# Patient Record
Sex: Female | Born: 1945 | Race: Black or African American | Hispanic: No | Marital: Married | Smoking: Never smoker
Health system: Southern US, Community
[De-identification: ages and names within clinical notes are randomized; demographics above are authoritative.]

---

## 2004-10-09 ENCOUNTER — Emergency Department (HOSPITAL_COMMUNITY): Admission: EM | Admit: 2004-10-09 | Discharge: 2004-10-09 | Payer: Self-pay | Admitting: Emergency Medicine

## 2009-12-25 ENCOUNTER — Encounter: Admission: RE | Admit: 2009-12-25 | Discharge: 2009-12-25 | Payer: Self-pay | Admitting: Family Medicine

## 2010-01-23 ENCOUNTER — Encounter: Admission: RE | Admit: 2010-01-23 | Discharge: 2010-01-23 | Payer: Self-pay | Admitting: Family Medicine

## 2011-10-15 IMAGING — US US BREAST BILAT
1 series · 14 of 15 positions shown · non-contrast
Comparison: 12/25/2009

CLINICAL DATA: Further evaluation of bilateral upper outer
quadrant asymmetries

DIGITAL DIAGNOSTIC BILATERAL MAMMOGRAM  AND BILATERAL BREAST
ULTRASOUND:

[Series 1: us breast bilat · 14 of 15 slices shown]
[im 1/15]
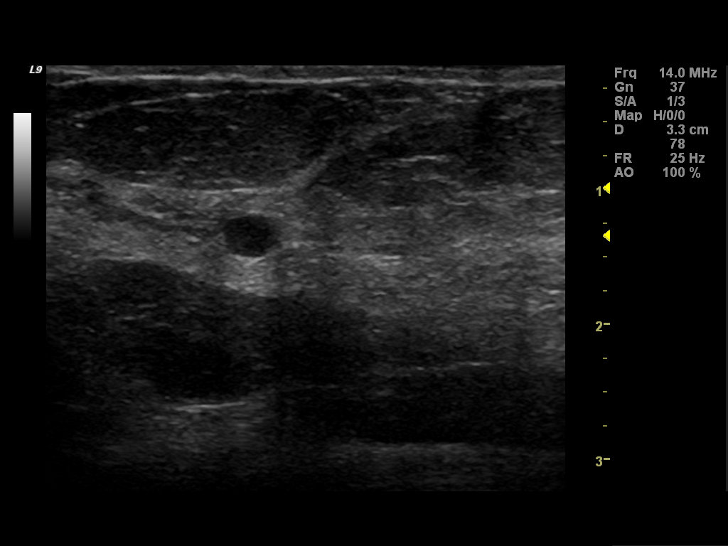
[im 2/15]
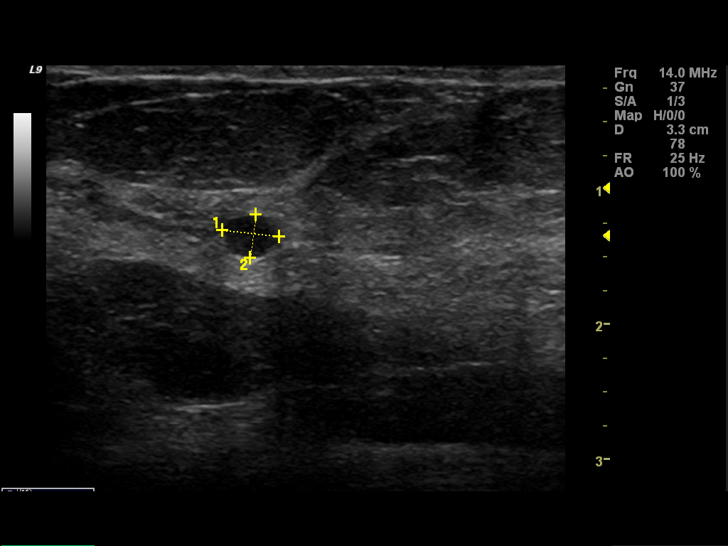
[im 3/15]
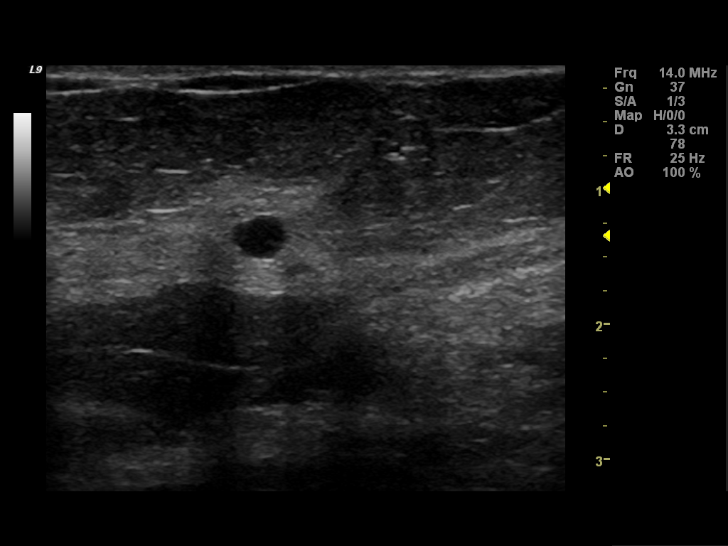
[im 4/15]
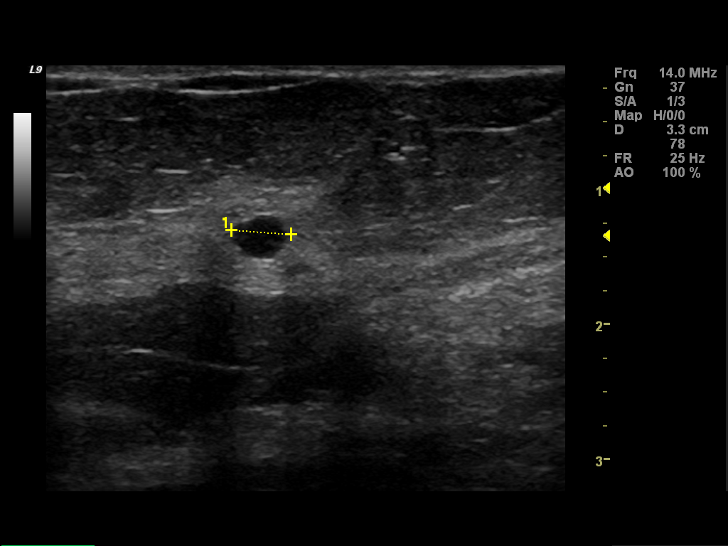
[im 5/15]
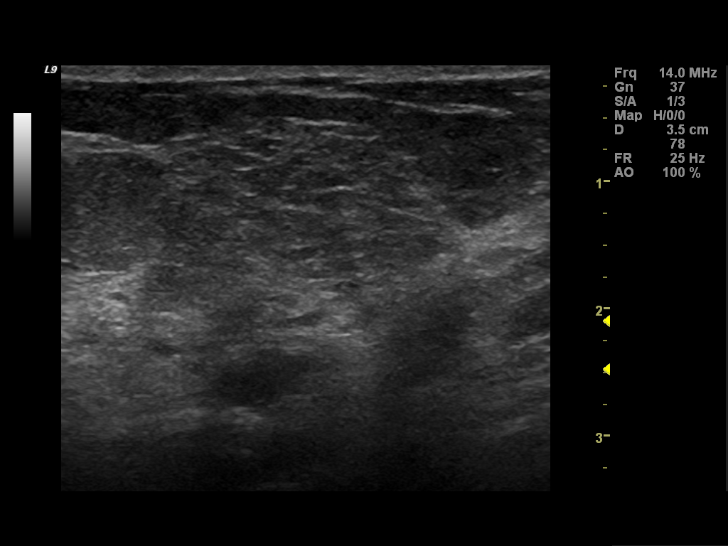
[im 6/15]
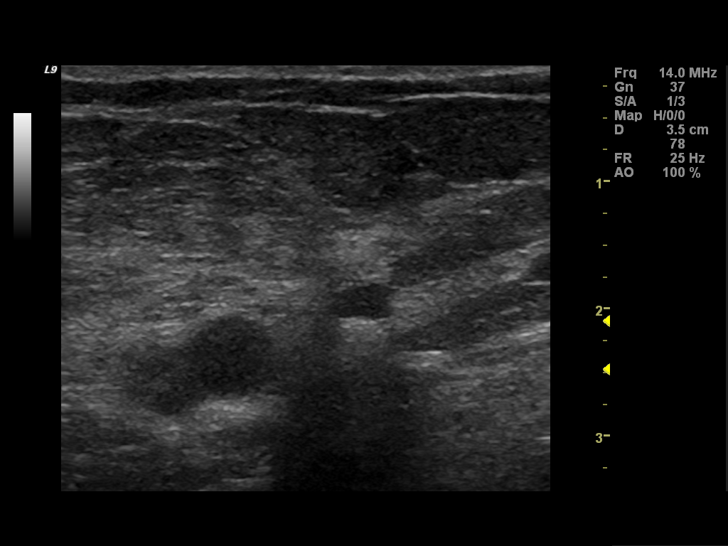
[im 7/15]
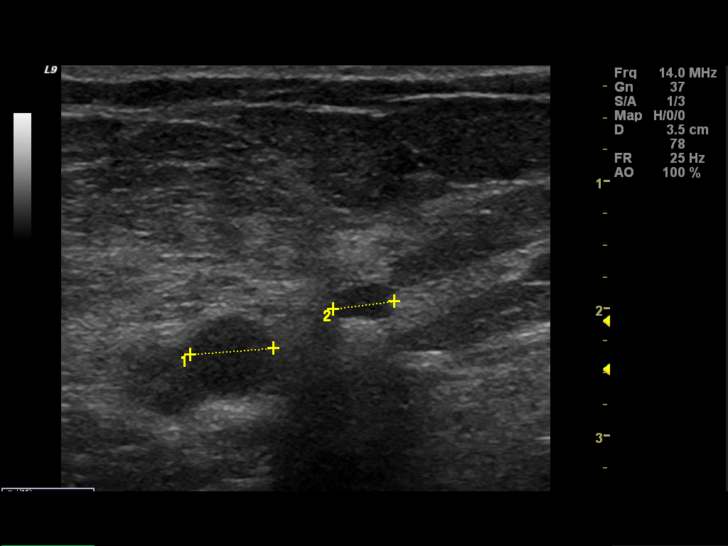
[im 9/15]
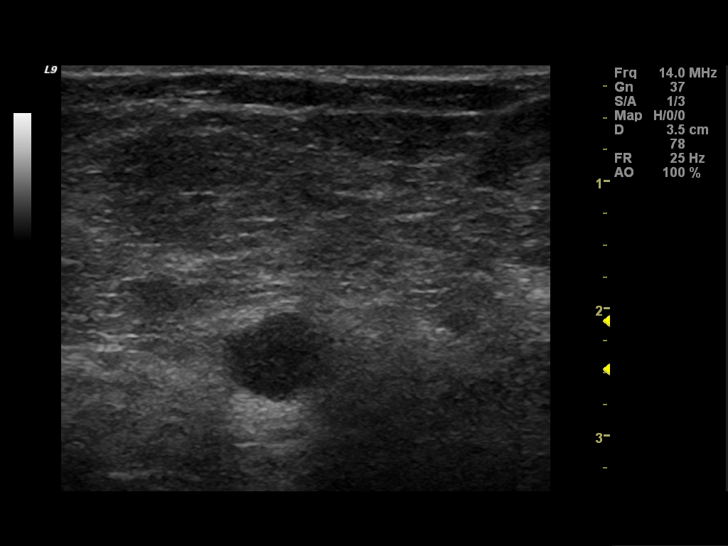
[im 10/15]
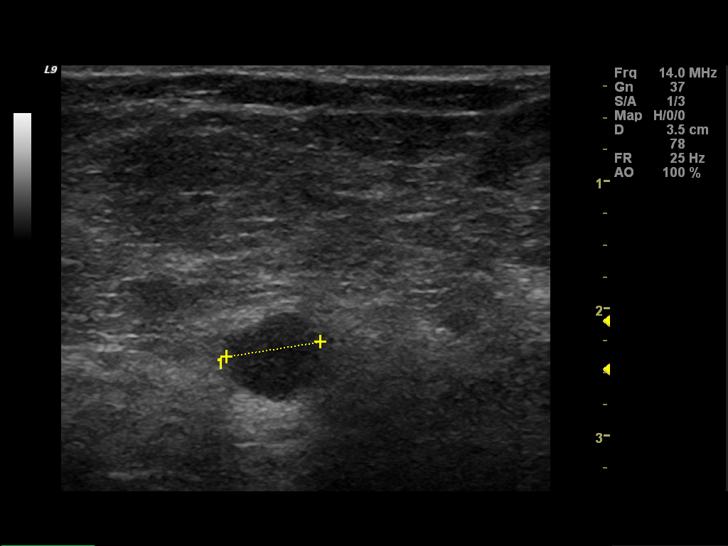
[im 11/15]
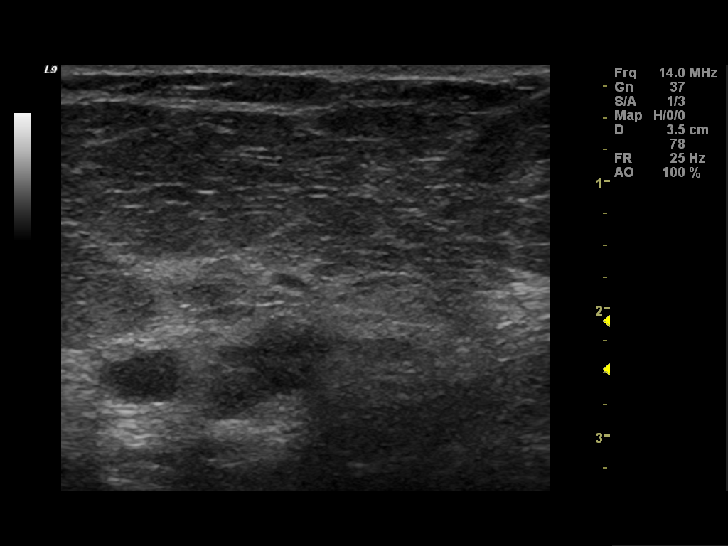
[im 12/15]
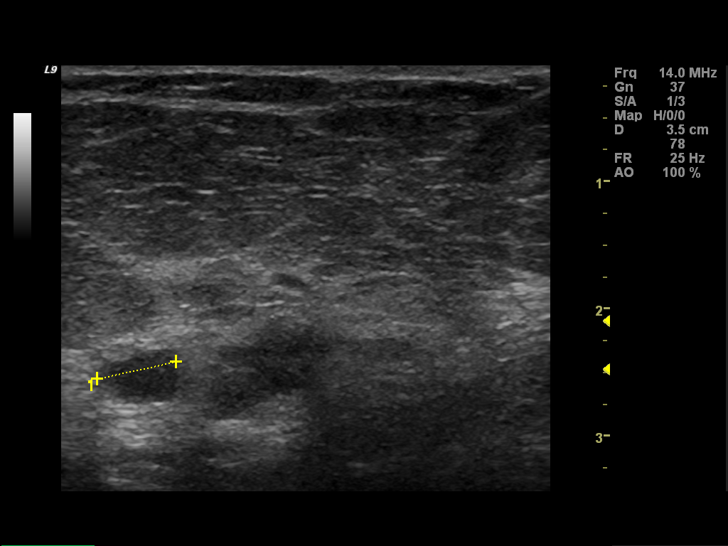
[im 13/15]
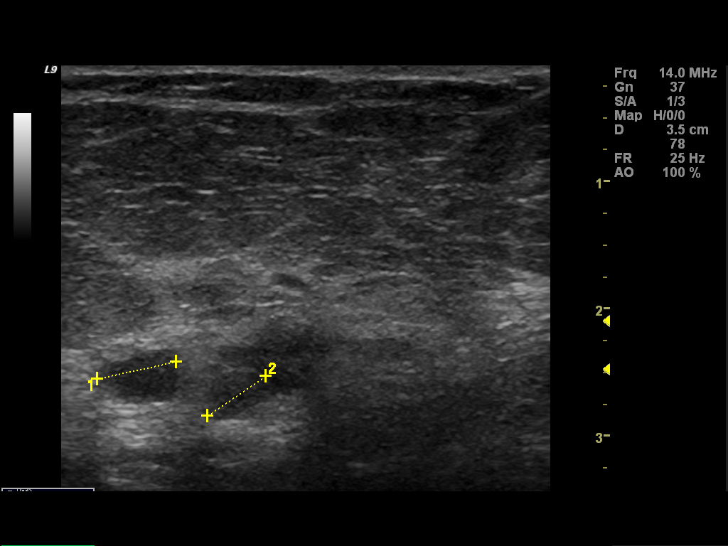
[im 14/15]
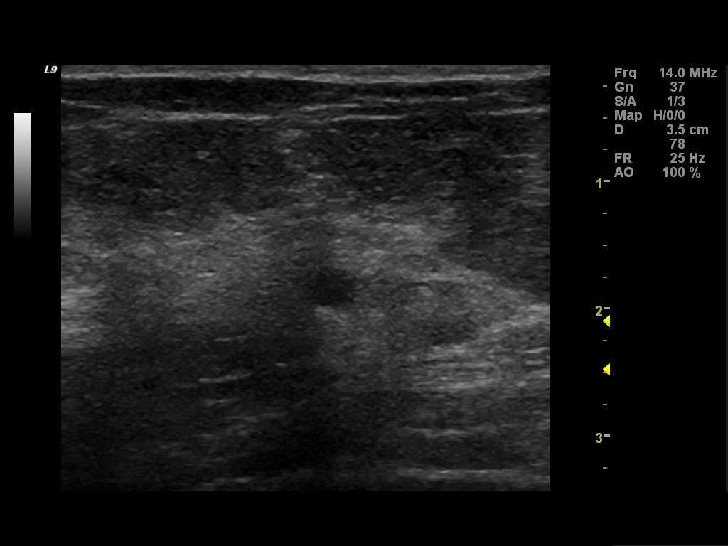
[im 15/15]
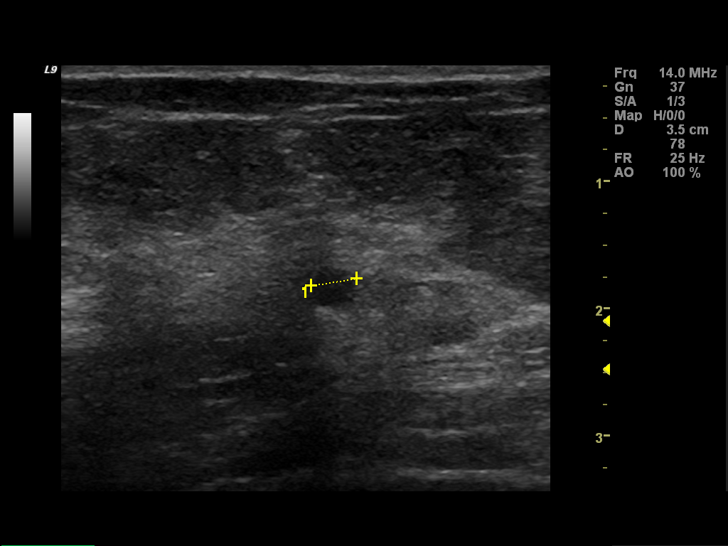

[14 of 15 positions shown; findings below may reference images not displayed]

FINDINGS: Persistent obscured 7 mm round upper outer quadrant mass
on the right.  Multiple obscured round isodense dense masses upper
outer quadrant on the left.

On physical exam, there are no palpable abnormalities.

Ultrasound is performed, showing at this simple cyst measuring 4 x
3 x 4 mm in the 10 o'clock right breast 4 cm from the nipple.
There are numerous simple cysts of the left 2 o'clock position 6 cm
from the nipple, measuring between five and 8 mm.
IMPRESSION: Bilateral cysts

BI-RADS CATEGORY 2:  Benign finding(s).

Recommendation:

Return to annual screening mammography.

## 2016-05-03 ENCOUNTER — Other Ambulatory Visit: Payer: Self-pay | Admitting: Family Medicine

## 2016-05-03 DIAGNOSIS — E2839 Other primary ovarian failure: Secondary | ICD-10-CM

## 2016-05-03 DIAGNOSIS — Z1231 Encounter for screening mammogram for malignant neoplasm of breast: Secondary | ICD-10-CM

## 2016-06-18 ENCOUNTER — Ambulatory Visit: Payer: Self-pay

## 2016-06-18 ENCOUNTER — Other Ambulatory Visit: Payer: Self-pay

## 2018-04-29 ENCOUNTER — Encounter: Payer: Self-pay | Admitting: *Deleted

## 2018-04-29 DIAGNOSIS — F411 Generalized anxiety disorder: Secondary | ICD-10-CM

## 2018-04-29 DIAGNOSIS — F3175 Bipolar disorder, in partial remission, most recent episode depressed: Secondary | ICD-10-CM | POA: Insufficient documentation

## 2018-04-29 DIAGNOSIS — G4700 Insomnia, unspecified: Secondary | ICD-10-CM

## 2018-05-23 ENCOUNTER — Ambulatory Visit: Payer: Self-pay | Admitting: Physician Assistant

## 2018-07-04 ENCOUNTER — Ambulatory Visit: Payer: Medicare Other | Admitting: Physician Assistant

## 2018-07-04 ENCOUNTER — Encounter: Payer: Self-pay | Admitting: Physician Assistant

## 2018-07-04 DIAGNOSIS — F319 Bipolar disorder, unspecified: Secondary | ICD-10-CM

## 2018-07-04 DIAGNOSIS — G4700 Insomnia, unspecified: Secondary | ICD-10-CM

## 2018-07-04 DIAGNOSIS — F411 Generalized anxiety disorder: Secondary | ICD-10-CM | POA: Diagnosis not present

## 2018-07-04 MED ORDER — ZOLPIDEM TARTRATE 10 MG PO TABS: 10.0000 mg | ORAL_TABLET | Freq: Every evening | ORAL | 1 refills | Status: DC | PRN

## 2018-07-04 MED ORDER — QUETIAPINE FUMARATE 400 MG PO TABS: 400.0000 mg | ORAL_TABLET | Freq: Every day | ORAL | 1 refills | Status: DC

## 2018-07-04 MED ORDER — LORAZEPAM 1 MG PO TABS: 1.0000 mg | ORAL_TABLET | Freq: Three times a day (TID) | ORAL | 1 refills | Status: DC | PRN

## 2018-07-04 NOTE — Progress Notes (Signed)
[facility]  Patient ID: Joy SchmidtBernice Reichenbach,  MRN: [id number]  PCP: Patient, No Pcp Per  Date of Evaluation: 07/04/2018 Time spent:15 minutes  Chief Complaint:  Chief Complaint    Medication Refill      HISTORY/CURRENT STATUS: HPI Here for 6 month med check.  Patient denies loss of interest in usual activities and is able to enjoy things.  Denies decreased energy or motivation.  Appetite has not changed.  No extreme sadness, tearfulness, or feelings of hopelessness.  Denies any changes in concentration, making decisions or remembering things.  Denies suicidal or homicidal thoughts.  Patient denies increased energy with decreased need for sleep, no increased talkativeness, no racing thoughts, no impulsivity or risky behaviors, no increased spending, no increased libido, no grandiosity.  She sleeps well as long as she has the Ambien.  Anxiety is well controlled.  She uses the Ativan times a day when needed and it is effective.  She denies any falls, or new medical problems.  No recent surgeries since LOV.  No new medications.  She and her husband still like to travel a lot.  Since she was here last they went to [place] and had a good time.  They are going to visit their daughter in [place] over Thanksgiving and are looking forward to that.  Individual Medical History/ Review of Systems: Changes? :No    Past medications for mental health diagnoses include: Seroquel, Ativan, Ambien  Allergies: Aspirin  Current Medications:  Current Outpatient Medications:  .  LORazepam (ATIVAN) 1 MG tablet, Take 1 tablet (1 mg total) by mouth every 8 (eight) hours as needed for anxiety., Disp: 135 tablet, Rfl: 1 .  QUEtiapine (SEROQUEL) 400 MG tablet, Take 1 tablet (400 mg total) by mouth at bedtime., Disp: 90 tablet, Rfl: 1 .  zolpidem (AMBIEN) 10 MG tablet, Take 1 tablet (10 mg total) by mouth at bedtime as needed for sleep., Disp: 90 tablet, Rfl: 1 Medication Side Effects:  none  Family Medical/ Social History: Changes? No  MENTAL HEALTH EXAM:  There were no vitals taken for this visit.There is no height or weight on file to calculate BMI.  General Appearance: Casual and Well Groomed  Eye Contact:  Good  Speech:  Clear and Coherent  Volume:  Normal  Mood:  Euthymic  Affect:  Appropriate  Thought Process:  Goal Directed  Orientation:  Full (Time, Place, and Person)  Thought Content: Logical   Suicidal Thoughts:  No  Homicidal Thoughts:  No  Memory:  WNL  Judgement:  Good  Insight:  Good  Psychomotor Activity:  Normal  Concentration:  Concentration: Good  Recall:  Good  Fund of Knowledge: Good  Language: Good  Assets:  Desire for Improvement  ADL's:  Intact she uses a walking cane to assist in ambulation  Cognition: WNL  Prognosis:  Good  See lipid panel and glucose on chart from January 2019.  DIAGNOSES:    ICD-10-CM   1. Bipolar I disorder (HCC) F31.9   2. Generalized anxiety disorder F41.1   3. Insomnia, unspecified type G47.00     Receiving Psychotherapy: No    RECOMMENDATIONS: I am glad to see her doing well. Continue all medications. Return in 6 months or sooner as needed.   Melony Overlyeresa Hakan Nudelman, PA-C

## 2019-01-02 ENCOUNTER — Ambulatory Visit (INDEPENDENT_AMBULATORY_CARE_PROVIDER_SITE_OTHER): Payer: Medicare Other | Admitting: Physician Assistant

## 2019-01-02 ENCOUNTER — Encounter: Payer: Self-pay | Admitting: Physician Assistant

## 2019-01-02 ENCOUNTER — Other Ambulatory Visit: Payer: Self-pay

## 2019-01-02 DIAGNOSIS — F411 Generalized anxiety disorder: Secondary | ICD-10-CM

## 2019-01-02 DIAGNOSIS — F319 Bipolar disorder, unspecified: Secondary | ICD-10-CM | POA: Diagnosis not present

## 2019-01-02 MED ORDER — QUETIAPINE FUMARATE 400 MG PO TABS: 400.0000 mg | ORAL_TABLET | Freq: Every day | ORAL | 1 refills | Status: DC

## 2019-01-02 MED ORDER — LORAZEPAM 1 MG PO TABS: 1.0000 mg | ORAL_TABLET | Freq: Three times a day (TID) | ORAL | 1 refills | Status: DC | PRN

## 2019-01-02 NOTE — Progress Notes (Signed)
[facility]  Patient ID: Joy Marshall,  MRN: [id number]  PCP: Patient, No Pcp Per  Date of Evaluation: 01/02/2019 Time spent:15 minutes  Chief Complaint:  Chief Complaint    Follow-up     Virtual Visit via Telephone Note  I connected with patient by a video enabled telemedicine application or telephone, with their informed consent, and verified patient privacy and that I am speaking with the correct person using two identifiers.  I am private, in my home and the patient is home.  I discussed the limitations, risks, security and privacy concerns of performing an evaluation and management service by telephone and the availability of in person appointments. I also discussed with the patient that there may be a patient responsible charge related to this service. The patient expressed understanding and agreed to proceed.   I discussed the assessment and treatment plan with the patient. The patient was provided an opportunity to ask questions and all were answered. The patient agreed with the plan and demonstrated an understanding of the instructions.   The patient was advised to call back or seek an in-person evaluation if the symptoms worsen or if the condition fails to improve as anticipated.  I provided 15 minutes of non-face-to-face time during this encounter.  HISTORY/CURRENT STATUS: HPI 54-month med check.  States she is doing really well.  She sleeps well and feels rested when she gets up.  She is able to enjoy things.  Energy and motivation are good.  She is not isolating any more than she has to for the coronavirus pandemic.  She does not cry easily.  Denies suicidal or homicidal thoughts.  Patient denies increased energy with decreased need for sleep, no increased talkativeness, no racing thoughts, no impulsivity or risky behaviors, no increased spending, no increased libido, no grandiosity.  Denies dizziness, syncope, seizures, numbness, tingling, tremor, tics,  unsteady gait, slurred speech, confusion. Denies muscle or joint pain, stiffness, or dystonia.  Individual Medical History/ Review of Systems: Changes? :No    Past medications for mental health diagnoses include: Seroquel, Ativan, Ambien  Allergies: Aspirin  Current Medications:  Current Outpatient Medications:  .  LORazepam (ATIVAN) 1 MG tablet, Take 1 tablet (1 mg total) by mouth every 8 (eight) hours as needed for anxiety., Disp: 135 tablet, Rfl: 1 .  Multiple Vitamin (MULTIVITAMIN) tablet, Take 1 tablet by mouth daily., Disp: , Rfl:  .  QUEtiapine (SEROQUEL) 400 MG tablet, Take 1 tablet (400 mg total) by mouth at bedtime., Disp: 90 tablet, Rfl: 1 .  zolpidem (AMBIEN) 10 MG tablet, Take 1 tablet (10 mg total) by mouth at bedtime as needed for sleep., Disp: 90 tablet, Rfl: 1 Medication Side Effects: none   Family Medical/ Social History: Changes? No  MENTAL HEALTH EXAM:  There were no vitals taken for this visit.There is no height or weight on file to calculate BMI.  General Appearance: Unable to assess  Eye Contact:  Unable to assess  Speech:  Clear and Coherent  Volume:  Normal  Mood:  Euthymic  Affect:  Unable to assess  Thought Process:  Goal Directed  Orientation:  Full (Time, Place, and Person)  Thought Content: Logical   Suicidal Thoughts:  No  Homicidal Thoughts:  No  Memory:  WNL  Judgement:  Good  Insight:  Good  Psychomotor Activity:  Unable to assess  Concentration:  Concentration: Good  Recall:  Good  Fund of Knowledge: Good  Language: Good  Assets:  Desire for Improvement  ADL's:  Intact  Cognition: WNL  Prognosis:  Good    DIAGNOSES:    ICD-10-CM   1. Bipolar I disorder (HCC) F31.9   2. Generalized anxiety disorder F41.1     Receiving Psychotherapy: No    RECOMMENDATIONS: I am glad to see her doing so well! Continue Seroquel 400 mg nightly. Continue Ativan 1 mg 1 every 8 hours PRN, with occasional extra half as needed during the day. Return  in 6 months.  Melony Overlyeresa Winford Hehn, PA-C   This record has been created using [employer] software.  Chart creation errors have been sought, but may not always have been located and corrected. Such creation errors do not reflect on the standard of medical care.

## 2019-07-09 ENCOUNTER — Ambulatory Visit (INDEPENDENT_AMBULATORY_CARE_PROVIDER_SITE_OTHER): Payer: Medicare Other | Admitting: Physician Assistant

## 2019-07-09 ENCOUNTER — Other Ambulatory Visit: Payer: Self-pay

## 2019-07-09 ENCOUNTER — Encounter: Payer: Self-pay | Admitting: Physician Assistant

## 2019-07-09 DIAGNOSIS — F411 Generalized anxiety disorder: Secondary | ICD-10-CM

## 2019-07-09 DIAGNOSIS — F319 Bipolar disorder, unspecified: Secondary | ICD-10-CM | POA: Diagnosis not present

## 2019-07-09 MED ORDER — QUETIAPINE FUMARATE 400 MG PO TABS: 400.0000 mg | ORAL_TABLET | Freq: Every day | ORAL | 1 refills | Status: DC

## 2019-07-09 MED ORDER — LORAZEPAM 1 MG PO TABS: 1.0000 mg | ORAL_TABLET | Freq: Three times a day (TID) | ORAL | 1 refills | Status: DC | PRN

## 2019-07-09 NOTE — Progress Notes (Signed)
[facility]  Patient ID: Joy Marshall,  MRN: [id number]  PCP: Patient, No Pcp Per  Date of Evaluation: 07/09/2019 Time spent:15 minutes  Chief Complaint:  Chief Complaint    Follow-up      HISTORY/CURRENT STATUS: HPI for 7-month med check.  States she is doing very well under the circumstances.  She and her husband state and a lot and are as careful as possible to not get COVID.  Neither of them have been sick.  She is getting tired of having to stay and all the time and not having any place to go.  "I feel like if everybody would do this then COVID would go away quickly.  But they want."  Feels like her medications are working.  She sleeps well.  The anxiety is well controlled.Patient denies loss of interest in usual activities and is able to enjoy things.  Denies decreased energy or motivation.  Appetite has not changed.  No extreme sadness, tearfulness, or feelings of hopelessness.  Denies any changes in concentration, making decisions or remembering things.  Denies suicidal or homicidal thoughts.  Patient denies increased energy with decreased need for sleep, no increased talkativeness, no racing thoughts, no impulsivity or risky behaviors, no increased spending, no increased libido, no grandiosity.  No hallucinations.  Denies dizziness, syncope, seizures, numbness, tingling, tremor, tics, unsteady gait, slurred speech, confusion. Denies muscle or joint pain, stiffness, or dystonia.  Individual Medical History/ Review of Systems: Changes? :No    Past medications for mental health diagnoses include: Seroquel, Ativan, Ambien  Allergies: Aspirin  Current Medications:  Current Outpatient Medications:  .  LORazepam (ATIVAN) 1 MG tablet, Take 1 tablet (1 mg total) by mouth every 8 (eight) hours as needed for anxiety., Disp: 135 tablet, Rfl: 1 .  Multiple Vitamin (MULTIVITAMIN) tablet, Take 1 tablet by mouth daily., Disp: , Rfl:  .  QUEtiapine (SEROQUEL) 400 MG  tablet, Take 1 tablet (400 mg total) by mouth at bedtime., Disp: 90 tablet, Rfl: 1 .  zolpidem (AMBIEN) 10 MG tablet, Take 1 tablet (10 mg total) by mouth at bedtime as needed for sleep., Disp: 90 tablet, Rfl: 1 Medication Side Effects: none  Family Medical/ Social History: Changes? No  MENTAL HEALTH EXAM:  There were no vitals taken for this visit.There is no height or weight on file to calculate BMI.  General Appearance: Casual, Neat and Well Groomed  Eye Contact:  Good  Speech:  Clear and Coherent  Volume:  Normal  Mood:  Euthymic  Affect:  Appropriate  Thought Process:  Goal Directed  Orientation:  Full (Time, Place, and Person)  Thought Content: Logical   Suicidal Thoughts:  No  Homicidal Thoughts:  No  Memory:  WNL  Judgement:  Good  Insight:  Good  Psychomotor Activity:  Normal  Concentration:  Concentration: Good  Recall:  Good  Fund of Knowledge: Good  Language: Good  Assets:  Desire for Improvement  ADL's:  Intact  Cognition: WNL  Prognosis:  Good    DIAGNOSES:    ICD-10-CM   1. Bipolar I disorder (HCC)  F31.9   2. Generalized anxiety disorder  F41.1     Receiving Psychotherapy: No    RECOMMENDATIONS:  I am glad to see her doing so well! Continue Seroquel 400 mg, 1 p.o. nightly. Continue Ativan 1 mg, 1/2-1 p.o. 3 times daily as needed. PDMP was reviewed. She has a complete physical coming up in January.  I have asked her to get labs, specifically hemoglobin  A1c, lipid panel, and CMP drawn for me as well. Return in 6 months.  Donnal Moat, PA-C

## 2019-12-21 ENCOUNTER — Other Ambulatory Visit: Payer: Self-pay | Admitting: Physician Assistant

## 2019-12-21 NOTE — Telephone Encounter (Signed)
Refill due around 05/17, next apt 05/25

## 2020-01-01 ENCOUNTER — Ambulatory Visit: Payer: Medicare Other | Admitting: Physician Assistant

## 2020-03-13 ENCOUNTER — Other Ambulatory Visit: Payer: Self-pay | Admitting: Physician Assistant

## 2020-06-08 ENCOUNTER — Other Ambulatory Visit: Payer: Self-pay | Admitting: Physician Assistant

## 2020-09-29 ENCOUNTER — Other Ambulatory Visit: Payer: Self-pay | Admitting: Physician Assistant

## 2021-04-15 ENCOUNTER — Ambulatory Visit: Payer: Medicare Other | Admitting: Physician Assistant

## 2021-04-15 ENCOUNTER — Encounter: Payer: Self-pay | Admitting: Physician Assistant

## 2021-04-15 ENCOUNTER — Other Ambulatory Visit: Payer: Self-pay

## 2021-04-15 DIAGNOSIS — F319 Bipolar disorder, unspecified: Secondary | ICD-10-CM | POA: Diagnosis not present

## 2021-04-15 DIAGNOSIS — F411 Generalized anxiety disorder: Secondary | ICD-10-CM

## 2021-04-15 MED ORDER — QUETIAPINE FUMARATE 400 MG PO TABS: 400.0000 mg | ORAL_TABLET | Freq: Every day | ORAL | 1 refills | Status: DC

## 2021-04-15 MED ORDER — LORAZEPAM 1 MG PO TABS: 1.0000 mg | ORAL_TABLET | Freq: Three times a day (TID) | ORAL | 1 refills | Status: DC | PRN

## 2021-04-15 NOTE — Progress Notes (Signed)
[facility]  Patient ID: Joy Marshall,  MRN: [id number]  PCP: Patient, No Pcp Per (Inactive)  Date of Evaluation: 04/15/2021 Time spent:30 minutes  Chief Complaint:  Chief Complaint   Follow-up     HISTORY/CURRENT STATUS: HPI for 52-month med check.  Lost to follow-up for over a year, she had enough medications and did not feel like she needed to come.  She presented to her PCP a few months ago requesting refill for Ativan.  She wanted to come back here for further refills.  States her PCP would not give her enough and she was requiring her to go in for an appointment each time.  When the patient was seen here routinely we were going every 6 months between visits because she was doing well.          States she is still doing well.Patient denies loss of interest in usual activities and is able to enjoy things.  Denies decreased energy or motivation.  Appetite has not changed.  No extreme sadness, tearfulness, or feelings of hopelessness.  Denies any changes in concentration, making decisions or remembering things.  Denies suicidal or homicidal thoughts.  Anxiety is controlled.  Not having panic attacks just more feelings of unease and worry at times.  She usually takes one Ativan every day sometimes 1.5 pills though.  She is kind of worried about her husband who is having some heart problems.  He may be having a pacemaker placed soon.  Patient denies increased energy with decreased need for sleep, no increased talkativeness, no racing thoughts, no impulsivity or risky behaviors, no increased spending, no increased libido, no grandiosity, no increased irritability or anger, and no hallucinations.  Denies dizziness, syncope, seizures, numbness, tingling, tremor, tics, unsteady gait, slurred speech, confusion. Denies muscle or joint pain, stiffness, or dystonia.  Individual Medical History/ Review of Systems: Changes? :No    Past medications for mental health diagnoses  include: Seroquel, Ativan, Ambien  Allergies: Aspirin  Current Medications:  Current Outpatient Medications:    Multiple Vitamin (MULTIVITAMIN) tablet, Take 1 tablet by mouth daily., Disp: , Rfl:    LORazepam (ATIVAN) 1 MG tablet, Take 1 tablet (1 mg total) by mouth every 8 (eight) hours as needed for anxiety., Disp: 135 tablet, Rfl: 1   QUEtiapine (SEROQUEL) 400 MG tablet, Take 1 tablet (400 mg total) by mouth at bedtime., Disp: 90 tablet, Rfl: 1   zolpidem (AMBIEN) 10 MG tablet, Take 1 tablet (10 mg total) by mouth at bedtime as needed for sleep., Disp: 90 tablet, Rfl: 1 Medication Side Effects: none  Family Medical/ Social History: Changes? No  MENTAL HEALTH EXAM:  There were no vitals taken for this visit.There is no height or weight on file to calculate BMI.  General Appearance: Casual, Neat and Well Groomed  Eye Contact:  Good  Speech:  Clear and Coherent  Volume:  Normal  Mood:  Euthymic  Affect:  Appropriate  Thought Process:  Goal Directed and Descriptions of Associations: Circumstantial  Orientation:  Full (Time, Place, and Person)  Thought Content: Logical   Suicidal Thoughts:  No  Homicidal Thoughts:  No  Memory:  WNL  Judgement:  Good  Insight:  Good  Psychomotor Activity:  Normal  Concentration:  Concentration: Good  Recall:  Good  Fund of Knowledge: Good  Language: Good  Assets:  Desire for Improvement  ADL's:  Intact  Cognition: WNL  Prognosis:  Good   Labs through [facility] 12/26/2020 were reviewed. Specifically glucose 95.  Lipid panel, total cholesterol is 270, triglycerides 100, HDL 60, LDL 193.  DIAGNOSES:    ICD-10-CM   1. Bipolar I disorder (HCC)  F31.9     2. Generalized anxiety disorder  F41.1        Receiving Psychotherapy: No    RECOMMENDATIONS:  PDMP reviewed last Ativan per PCP 02/24/2021-no red flags. I provided 30 minutes of face to face time during this encounter, including time spent before and after the visit in records  review, medical decision making, and charting.  She is doing well so no changes will be made. Continue Seroquel 400 mg, 1 p.o. nightly. Continue Ativan 1 mg, 1/2-1 p.o. 3 times daily as needed. Return in 6 months.  Melony Overly, PA-C

## 2021-10-13 ENCOUNTER — Ambulatory Visit: Payer: Medicare Other | Admitting: Physician Assistant

## 2021-10-13 ENCOUNTER — Other Ambulatory Visit: Payer: Self-pay

## 2021-10-13 ENCOUNTER — Encounter: Payer: Self-pay | Admitting: Physician Assistant

## 2021-10-13 DIAGNOSIS — F411 Generalized anxiety disorder: Secondary | ICD-10-CM

## 2021-10-13 DIAGNOSIS — F319 Bipolar disorder, unspecified: Secondary | ICD-10-CM

## 2021-10-13 MED ORDER — LORAZEPAM 1 MG PO TABS: 1.0000 mg | ORAL_TABLET | Freq: Three times a day (TID) | ORAL | 1 refills | Status: DC | PRN

## 2021-10-13 MED ORDER — QUETIAPINE FUMARATE 400 MG PO TABS: 400.0000 mg | ORAL_TABLET | Freq: Every day | ORAL | 1 refills | Status: DC

## 2021-10-13 NOTE — Progress Notes (Signed)
[facility] ? ?Patient ID: Joy Marshall,  ?MRN: [id number] ? ?PCP: Patient, No Pcp Per (Inactive) ? ?Date of Evaluation: 10/13/2021 ?Time spent:30 minutes ? ?Chief Complaint:  ?Chief Complaint   ?Anxiety; Follow-up ?  ? ? ?HISTORY/CURRENT STATUS: ?HPI For 6 month med check. ? ?Doing well.  Initially she asked if she could decrease the dose of her medicines or even go off but then states that she tried that long ago on 4 different occasions and it did not go well so she better keep things like they are. ? ?Patient denies loss of interest in usual activities and is able to enjoy things.  She has not been traveling very much, mostly still because of COVID.  She and her husband do like to travel though.  Denies decreased energy or motivation.  Appetite has not changed.  No extreme sadness, tearfulness, or feelings of hopelessness.  Denies any changes in concentration, making decisions or remembering things.  She sleeps well.  Does have anxiety still and needs the Ativan but usually only takes 1/2 pill during the day when she needs it.  Denies suicidal or homicidal thoughts. ? ?Patient denies increased energy with decreased need for sleep, no increased talkativeness, no racing thoughts, no impulsivity or risky behaviors, no increased spending, no increased libido, no grandiosity, no increased irritability or anger, no paranoia, and no hallucinations. ? ?Denies dizziness, syncope, seizures, numbness, tingling, tremor, tics, unsteady gait, slurred speech, confusion. Denies muscle or joint pain, stiffness, or dystonia. Denies unexplained weight loss, frequent infections, or sores that heal slowly.  No polyphagia, polydipsia, or polyuria. Denies visual changes or paresthesias.  ? ?Individual Medical History/ Review of Systems: Changes? :No  ? ?Past medications for mental health diagnoses include: ?Seroquel, Ativan, Ambien ? ?Allergies: Aspirin ? ?Current Medications:  ?Current Outpatient Medications:  ?   Multiple Vitamin (MULTIVITAMIN) tablet, Take 1 tablet by mouth daily., Disp: , Rfl:  ?  LORazepam (ATIVAN) 1 MG tablet, Take 1 tablet (1 mg total) by mouth every 8 (eight) hours as needed for anxiety., Disp: 135 tablet, Rfl: 1 ?  QUEtiapine (SEROQUEL) 400 MG tablet, Take 1 tablet (400 mg total) by mouth at bedtime., Disp: 90 tablet, Rfl: 1 ?Medication Side Effects: none ? ?Family Medical/ Social History: Changes? No ? ?MENTAL HEALTH EXAM: ? ?There were no vitals taken for this visit.There is no height or weight on file to calculate BMI.  ?General Appearance: Casual, Neat, Well Groomed, and Obese  ?Eye Contact:  Good  ?Speech:  Clear and Coherent and Normal Rate  ?Volume:  Decreased  ?Mood:  Euthymic  ?Affect:  Congruent  ?Thought Process:  Goal Directed and Descriptions of Associations: Circumstantial  ?Orientation:  Full (Time, Place, and Person)  ?Thought Content: Logical   ?Suicidal Thoughts:  No  ?Homicidal Thoughts:  No  ?Memory:  WNL  ?Judgement:  Good  ?Insight:  Good  ?Psychomotor Activity:   Walks with a cane  ?Concentration:  Concentration: Good  ?Recall:  Good  ?Fund of Knowledge: Good  ?Language: Good  ?Assets:  Desire for Improvement  ?ADL's:  Intact  ?Cognition: WNL  ?Prognosis:  Good  ? ?Labs 09/16/2021 under Care Everywhere through [facility] ?Glucose 84, remainder of CMP normal ?Total cholesterol 179, triglycerides 50, HDL 57, LDL 112 ? ? ?DIAGNOSES:  ?  ICD-10-CM   ?1. Bipolar I disorder ([place])  F31.9   ?  ?2. Generalized anxiety disorder  F41.1   ?  ? ? ?Receiving Psychotherapy: No  ? ? ?  RECOMMENDATIONS:  ?PDMP reviewed.  Ativan filled 07/23/2021. ?I provided 30 minutes of face to face time during this encounter, including time spent before and after the visit in records review, medical decision making, counseling pertinent to today's visit, and charting.  ?We discussed whether she should decrease the dose of Seroquel and Ativan or not.  I do not think it is a good idea for more than 1 reason but  especially since she has gone off her mental health medications for different times in the past 15 years or so and "it did not go well" so I have no reason to think it would go well at this time.  She is mentally healthy on these medications, is having no side effects from them, and her metabolic panels show no problems from being on an antipsychotic.  So we agree for her to continue same medications and doses. ? ?Continue Seroquel 400 mg, 1 p.o. nightly. ?Continue Ativan 1 mg, 1 p.o. 3 times daily as needed.  Use as sparingly as possible, risk of falls increase with age, she understands and accepts that risk. ?Return in 6 months. ? ?Donnal Moat, PA-C  ?

## 2022-04-15 ENCOUNTER — Ambulatory Visit (INDEPENDENT_AMBULATORY_CARE_PROVIDER_SITE_OTHER): Payer: Self-pay | Admitting: Physician Assistant

## 2022-04-15 DIAGNOSIS — Z91199 Patient's noncompliance with other medical treatment and regimen due to unspecified reason: Secondary | ICD-10-CM

## 2022-04-15 NOTE — Progress Notes (Signed)
No show

## 2022-04-19 ENCOUNTER — Other Ambulatory Visit: Payer: Self-pay | Admitting: Physician Assistant

## 2022-04-21 NOTE — Telephone Encounter (Signed)
Please schedule appt

## 2022-04-23 ENCOUNTER — Other Ambulatory Visit: Payer: Self-pay

## 2022-06-02 ENCOUNTER — Ambulatory Visit (INDEPENDENT_AMBULATORY_CARE_PROVIDER_SITE_OTHER): Payer: Medicare Other | Admitting: Physician Assistant

## 2022-06-02 ENCOUNTER — Telehealth: Payer: Self-pay | Admitting: Adult Health

## 2022-06-02 ENCOUNTER — Encounter: Payer: Self-pay | Admitting: Physician Assistant

## 2022-06-02 DIAGNOSIS — F411 Generalized anxiety disorder: Secondary | ICD-10-CM | POA: Diagnosis not present

## 2022-06-02 DIAGNOSIS — F319 Bipolar disorder, unspecified: Secondary | ICD-10-CM

## 2022-06-02 MED ORDER — QUETIAPINE FUMARATE 400 MG PO TABS: 400.0000 mg | ORAL_TABLET | Freq: Every day | ORAL | 1 refills | Status: DC

## 2022-06-02 MED ORDER — LORAZEPAM 1 MG PO TABS: 1.0000 mg | ORAL_TABLET | Freq: Three times a day (TID) | ORAL | 1 refills | Status: DC | PRN

## 2022-06-02 NOTE — Progress Notes (Signed)
[facility]  Patient ID: Joy Marshall,  MRN: [id number]  PCP: Patient, No Pcp Per  Date of Evaluation: 06/02/2022 Time spent:30 minutes  Chief Complaint:  Chief Complaint   Anxiety; Depression    HISTORY/CURRENT STATUS: HPI For 6 month med check.  States she is doing well.  Her mental health is stable.  Feels that her medications are working well.  She does take the Ativan daily.  Gets overwhelmed and feels like something bad may happen if she does not take it.  Not having panic attacks.  She sleeps well.  Patient is able to enjoy things although she does not do much.  Energy and motivation are fair to good, depends on the day.   No extreme sadness, tearfulness, or feelings of hopelessness.  Personal hygiene is normal.   She is able to do what absolutely has to be done around the house.  The other stuff she lets go.  Denies any changes in concentration, making decisions, or remembering things.  Appetite has not changed.  Weight is stable.  Denies suicidal or homicidal thoughts.  Patient denies increased energy with decreased need for sleep, increased talkativeness, racing thoughts, impulsivity or risky behaviors, increased spending, increased libido, grandiosity, increased irritability or anger, paranoia, or hallucinations.  Denies dizziness, syncope, seizures, numbness, tingling, tremor, tics, unsteady gait, slurred speech, confusion. Denies muscle or joint pain, stiffness, or dystonia. Denies unexplained weight loss, frequent infections, or sores that heal slowly.  No polyphagia, polydipsia, or polyuria. Denies visual changes or paresthesias.   Individual Medical History/ Review of Systems: Changes? :No   had labs recently  Past medications for mental health diagnoses include: Seroquel, Ativan, Ambien  Allergies: Aspirin  Current Medications:  Current Outpatient Medications:    Multiple Vitamin (MULTIVITAMIN) tablet, Take 1 tablet by mouth daily., Disp: , Rfl:     LORazepam (ATIVAN) 1 MG tablet, Take 1 tablet (1 mg total) by mouth every 8 (eight) hours as needed. for anxiety, Disp: 135 tablet, Rfl: 1   QUEtiapine (SEROQUEL) 400 MG tablet, Take 1 tablet (400 mg total) by mouth at bedtime., Disp: 90 tablet, Rfl: 1 Medication Side Effects: none  Family Medical/ Social History: Changes? No  MENTAL HEALTH EXAM:  There were no vitals taken for this visit.There is no height or weight on file to calculate BMI.  General Appearance: Casual, Neat, Well Groomed, and Obese  Eye Contact:  Good  Speech:  Clear and Coherent and Normal Rate  Volume:  Decreased  Mood:  Euthymic  Affect:  Congruent  Thought Process:  Goal Directed and Descriptions of Associations: Circumstantial  Orientation:  Full (Time, Place, and Person)  Thought Content: Logical   Suicidal Thoughts:  No  Homicidal Thoughts:  No  Memory:  WNL  Judgement:  Good  Insight:  Good  Psychomotor Activity:   Walks with a cane  Concentration:  Concentration: Good  Recall:  Good  Fund of Knowledge: Good  Language: Good  Assets:  Desire for Improvement Financial Resources/Insurance Housing Transportation  ADL's:  Intact  Cognition: WNL  Prognosis:  Good   Labs  05/31/2022 CBC nl CMP nl Lipids LDL 110, o/w nl  DIAGNOSES:    ICD-10-CM   1. Bipolar I disorder ([place])  F31.9     2. Generalized anxiety disorder  F41.1       Receiving Psychotherapy: No   RECOMMENDATIONS:  PDMP reviewed.  Ativan filled 04/23/2022. I provided 30 minutes of face to face time during this encounter, including time spent  before and after the visit in records review, medical decision making, counseling pertinent to today's visit, and charting.   She is doing well overall so no changes need to be made.  Continue Ativan 1 mg, 1 p.o. 3 times daily as needed.  Use as sparingly as possible, risk of falls increase with age, she understands and accepts that risk. Continue Seroquel 400 mg, 1 p.o. nightly. Return  in 6 months.  Melony Overly, PA-C

## 2022-11-11 ENCOUNTER — Ambulatory Visit: Payer: Medicare Other | Admitting: Physician Assistant

## 2022-11-17 ENCOUNTER — Encounter: Payer: Self-pay | Admitting: Physician Assistant

## 2022-11-17 ENCOUNTER — Ambulatory Visit (INDEPENDENT_AMBULATORY_CARE_PROVIDER_SITE_OTHER): Payer: BLUE CROSS/BLUE SHIELD | Admitting: Physician Assistant

## 2022-11-17 DIAGNOSIS — F319 Bipolar disorder, unspecified: Secondary | ICD-10-CM

## 2022-11-17 DIAGNOSIS — F411 Generalized anxiety disorder: Secondary | ICD-10-CM | POA: Diagnosis not present

## 2022-11-17 MED ORDER — LORAZEPAM 1 MG PO TABS: 1.0000 mg | ORAL_TABLET | Freq: Three times a day (TID) | ORAL | 1 refills | Status: DC | PRN

## 2022-11-17 MED ORDER — QUETIAPINE FUMARATE 400 MG PO TABS: 400.0000 mg | ORAL_TABLET | Freq: Every day | ORAL | 1 refills | Status: DC

## 2022-11-17 NOTE — Progress Notes (Signed)
[facility]  Patient ID: Joy Marshall,  MRN: [id number]  PCP: Patient, No Pcp Per  Date of Evaluation: 11/17/2022 Time spent:20 minutes  Chief Complaint:  Chief Complaint   Anxiety; Depression; Follow-up    HISTORY/CURRENT STATUS: HPI For 6 month med check.  Accompanied by her husband.  [name] is doing well.  She feels like her medications still work just fine. Patient is able to enjoy things.  Energy and motivation are fair to good.  No extreme sadness, tearfulness, or feelings of hopelessness.  Sleeps well and feels rested when she gets up.  ADLs and personal hygiene are normal.   Denies any changes in remembering things.  Appetite has not changed.  Weight is stable.  Anxiety is well controlled.  She takes the Ativan every evening and sometimes another half to 1 during the day.  If she does not take it she may feel overwhelmed.  No panic attacks.  Has taken a benzo for a very long time.  Denies suicidal or homicidal thoughts.  Patient denies increased energy with decreased need for sleep, increased talkativeness, racing thoughts, impulsivity or risky behaviors, increased spending, increased libido, grandiosity, increased irritability or anger, paranoia, or hallucinations.  Denies dizziness, syncope, seizures, numbness, tingling, tremor, tics, unsteady gait, slurred speech, confusion. Denies muscle or joint pain, stiffness, or dystonia. Denies unexplained weight loss, frequent infections, or sores that heal slowly.  No polyphagia, polydipsia, or polyuria. Denies visual changes or paresthesias.   Individual Medical History/ Review of Systems: Changes? :No     Past medications for mental health diagnoses include: Seroquel, Ativan, Ambien  Allergies: Aspirin  Current Medications:  Current Outpatient Medications:    Multiple Vitamin (MULTIVITAMIN) tablet, Take 1 tablet by mouth daily., Disp: , Rfl:    LORazepam (ATIVAN) 1 MG tablet, Take 1 tablet (1 mg total) by  mouth every 8 (eight) hours as needed. for anxiety, Disp: 135 tablet, Rfl: 1   QUEtiapine (SEROQUEL) 400 MG tablet, Take 1 tablet (400 mg total) by mouth at bedtime., Disp: 90 tablet, Rfl: 1 Medication Side Effects: none  Family Medical/ Social History: Changes? No  MENTAL HEALTH EXAM:  There were no vitals taken for this visit.There is no height or weight on file to calculate BMI.  General Appearance: Casual, Neat, Well Groomed, and Obese  Eye Contact:  Good  Speech:  Clear and Coherent and Normal Rate  Volume:  Decreased  Mood:  Euthymic  Affect:  Congruent  Thought Process:  Goal Directed and Descriptions of Associations: Circumstantial  Orientation:  Full (Time, Place, and Person)  Thought Content: Logical   Suicidal Thoughts:  No  Homicidal Thoughts:  No  Memory:  WNL  Judgement:  Good  Insight:  Good  Psychomotor Activity:   Walks with a cane  Concentration:  Concentration: Good  Recall:  Good  Fund of Knowledge: Good  Language: Good  Assets:  Desire for Improvement Financial Resources/Insurance Housing Social Support Transportation  ADL's:  Intact  Cognition: WNL  Prognosis:  Good   Labs followed by PCP.  She is due in May.  DIAGNOSES:    ICD-10-CM   1. Bipolar I disorder  F31.9     2. Generalized anxiety disorder  F41.1      Receiving Psychotherapy: No   RECOMMENDATIONS:  PDMP reviewed.  Ativan filled 07/12/2022. I provided 20 minutes of face to face time during this encounter, including time spent before and after the visit in records review, medical decision making, counseling pertinent to today's  visit, and charting.   She is doing well overall so no changes need to be made.  Continue Ativan 1 mg, 1 p.o. 3 times daily as needed.  Use as sparingly as possible, she understands and accepts the risk of falls and confusion that occur with benzodiazepines and aging. Continue Seroquel 400 mg, 1 p.o. nightly. Have labs drawn with her PCP as they  direct. Return in 6 months.  Melony Overly, PA-C

## 2022-12-02 ENCOUNTER — Ambulatory Visit: Payer: Medicare Other | Admitting: Physician Assistant

## 2023-02-28 ENCOUNTER — Ambulatory Visit (HOSPITAL_COMMUNITY)
Admission: EM | Admit: 2023-02-28 | Discharge: 2023-02-28 | Disposition: A | Discharge status: Home / Self Care | Payer: Medicare Other | Attending: Registered Nurse | Admitting: Registered Nurse

## 2023-02-28 ENCOUNTER — Encounter (HOSPITAL_COMMUNITY): Payer: Self-pay | Admitting: Registered Nurse

## 2023-02-28 DIAGNOSIS — R4182 Altered mental status, unspecified: Secondary | ICD-10-CM | POA: Diagnosis present

## 2023-02-28 NOTE — Discharge Instructions (Addendum)
Thought blocking, mental fog could be a result of medical condition such as an urinary tract infection or it could be related to psychotropic medication being to much or not taking as prescribed.  Talk to your psychiatrist about adding Cogentin or Artane to medication regimen for extrapyramidal symptoms.  Also talk to your primary doctor for blood work and urinalysis.

## 2023-02-28 NOTE — Progress Notes (Addendum)
   02/28/23 1315  [facility] Triage Screening (Walk-ins at [facility] only)  How Did You Hear About [place]? Family/Friend  What Is the Reason for Your Visit/Call Today? Pt presents to [facility] accompanied by her husband and daughter seeking medication management. Pts husband reports the pt stopped taking her medication for a while and started back taking it 4 weeks ago but her symptoms have not improved. Pts husband reports disorientation and decreased appetite. Pt is currently mumbling, having difficulty collecting her thoughts, and not speaking at a normal pace. Per pts husband she has an appointment with her psychiatrist at [facility] on August 23rd. Pt denies SI/HI and AVH.  How Long Has This Been Causing You Problems? 1 wk - 1 month  Have You Recently Had Any Thoughts About Hurting Yourself? No  Are You Planning to Commit Suicide/Harm Yourself At This time? No  Have you Recently Had Thoughts About Hurting Someone Karolee Ohs? No  Are You Planning To Harm Someone At This Time? No  Are you currently experiencing any auditory, visual or other hallucinations? No  Have You Used Any Alcohol or Drugs in the Past 24 Hours? No  Do you have any current medical co-morbidities that require immediate attention? No  Clinician description of patient physical appearance/behavior: mumbling, having difficulty collecting her thoughts, and not speaking at a normal pace.  What Do You Feel Would Help You the Most Today? Medication(s)  If access to [facility] was not available, would you have sought care in the Emergency Department? No  Determination of Need Routine (7 days)  Options For Referral Outpatient Therapy;Medication Management

## 2023-02-28 NOTE — ED Provider Notes (Addendum)
[facility]al Health Urgent Care Medical Screening Exam  Patient Name: Joy Marshall MRN: [id number] Date of Evaluation: 02/28/23 Chief Complaint:   altered mental status Diagnosis:  Final diagnoses:  Altered mental status, unspecified altered mental status type  (Thought blocking)  History of Present illness: Joy Marshall is a 77 y.o. female patient presented to [facility] as a walk in accompanied by her husband requesting information on medication  Joy Marshall, 77 y.o., female patient seen face to face by this provider, chart review, and consulted with Dr. Nelly Rout on 02/28/23.  On evaluation Joy Marshall initially just sits in chair and her husband does most of the talking.  Patient has been states that patient does not want to take her medications.  He reports the patient was stopped taking her medications and started to have symptoms "sort of like she is now" her medications will be restarted and usually after 2 weeks she is back to normal.  After this incident her medications were restarted and has been close to 4 weeks now but she is not back to her normal self and we just want to know if she needs to have a medication adjustment or switch to a different medication.  He reports patient has a scheduled medication management appointment at [facility] the first week of August.  Patient then reports that she does not want to take medication but she has been taking them patient is prescribed Ativan 1 mg every 8 hours as needed anxiety and Seroquel 400 mg at bedtime.  Patient denies suicidal/self-harm/homicidal ideations, psychosis, paranoia.  Patient doesn't give much more information.   During evaluation Joy Marshall is seated in exam room with her husband at her side.  There is no noted distress.  She is alert/oriented x 3, calm, cooperative, attentive, and responses were relevant and appropriate to assessment questions.  However patient was slow to respond with minimal words (thought  blocking."  Her husband stating that this is not her normal.  Her speech was more mumbled.  She maintained good eye contact.  She denies suicidal/self-harm/homicidal ideation, psychosis, and paranoia.  Objectively:  there is no evidence of psychosis/mania or delusional thinking.  Conversation was coherent with goal directed thoughts, no distractibility, or pre-occupation.  Educated on medical or psychiatric reason that could cause the same result.  Patient or her husband not interested in patient staying over night or psychiatric hospitalization stating she has an appointment first week in August.  Encouraged to call and have appointment moved up and to see Primary doctor for labs and urinalysis.  Instructed to take medications as ordered and to take one of her 1 mg Ativan at bed time.    At this time Joy Marshall is educated and verbalizes understanding of mental health resources and other crisis services in the community. She is instructed to call 911 and present to the nearest emergency room should she experience any suicidal/homicidal ideation, auditory/visual/hallucinations, or detrimental worsening of her mental health condition.    Flowsheet Row ED from 02/28/2023 in [facility]  C-SSRS RISK CATEGORY No Risk       Psychiatric Specialty Exam  Presentation  General Appearance:Appropriate for Environment; Casual  Eye Contact:Good  Speech:Slow  Speech Volume:Decreased  Handedness:No data recorded  Mood and Affect  Mood:Dysphoric  Affect:Congruent; Appropriate   Thought Process  Thought Processes:Coherent; Goal Directed; Linear  Descriptions of Associations:Intact  Orientation:Full (Time, Place and Person)  Thought Content:Logical    Hallucinations:None  Ideas of Reference:None  Suicidal Thoughts:No  Homicidal Thoughts:No   Sensorium  Memory:Immediate Fair; Recent Fair  Judgment:Intact  Insight:Present   Executive Functions   Concentration:Fair  Attention Span:Fair  Recall:Fair  [employer] of Knowledge:Good  Language:Good   Psychomotor Activity  Psychomotor Activity:No data recorded  Assets  Assets:Communication Skills; Desire for Improvement; Financial Resources/Insurance; Housing; Intimacy; Leisure Time; Resilience; Social Support; Transportation   Sleep  Sleep:Good  Number of hours: No data recorded  Physical Exam: Physical Exam Vitals and nursing note reviewed. Chaperone present: husband present.  Constitutional:      General: She is not in acute distress.    Appearance: Normal appearance. She is not ill-appearing.  HENT:     Head: Normocephalic.  Eyes:     Conjunctiva/sclera: Conjunctivae normal.  Cardiovascular:     Rate and Rhythm: Normal rate.  Pulmonary:     Effort: Pulmonary effort is normal.  Musculoskeletal:        General: Normal range of motion.     Cervical back: Normal range of motion.  Skin:    General: Skin is warm and dry.  Neurological:     Mental Status: She is alert and oriented to person, place, and time.  Psychiatric:        Attention and Perception: Attention and perception normal. She does not perceive auditory or visual hallucinations.        Mood and Affect: Affect normal. Mood is depressed.        Speech: Speech is delayed.        Behavior: Behavior normal. Behavior is cooperative.        Thought Content: Thought content normal. Thought content is not paranoid or delusional. Thought content does not include homicidal or suicidal ideation.        Judgment: Judgment normal.    Review of Systems  Constitutional:        No other complaints voiced   Psychiatric/Behavioral:  Depression: Stable. Hallucinations: Denies. Substance abuse: Denies. Suicidal ideas: Denies. Nervous/anxious: Stable. Insomnia: Denies.        Reports when talking it takes her a while to get her thoughts together and the words out.  States that she doesn't want to take medications but she  has been taking as prescribed.  Husband doesn't feel that she is taking as order and recently has been watching her take and swallow it "because she will cheek it or spit it out."   All other systems reviewed and are negative.  Blood pressure 124/77, pulse 97, resp. rate 18, SpO2 100%. There is no height or weight on file to calculate BMI.  Musculoskeletal: Strength & Muscle Tone: within normal limits Gait & Station:  Normal uses a cane to assist with ambulation Patient leans: N/A   [facility] MSE Discharge Disposition for Follow up and Recommendations: Based on my evaluation the patient does not appear to have an emergency medical condition and can be discharged with resources and follow up care in outpatient services for Medication Management and Individual Therapy    , NP 02/28/2023, 2:39 PM

## 2023-03-10 ENCOUNTER — Encounter: Payer: Self-pay | Admitting: Physician Assistant

## 2023-03-10 ENCOUNTER — Ambulatory Visit (INDEPENDENT_AMBULATORY_CARE_PROVIDER_SITE_OTHER): Payer: BLUE CROSS/BLUE SHIELD | Admitting: Physician Assistant

## 2023-03-10 DIAGNOSIS — F411 Generalized anxiety disorder: Secondary | ICD-10-CM

## 2023-03-10 DIAGNOSIS — F319 Bipolar disorder, unspecified: Secondary | ICD-10-CM

## 2023-03-10 DIAGNOSIS — Z6379 Other stressful life events affecting family and household: Secondary | ICD-10-CM

## 2023-03-10 NOTE — Progress Notes (Signed)
[facility]  Patient ID: Joy Marshall,  MRN: [id number]  PCP: Patient, No Pcp Per  Date of Evaluation: 03/10/2023 Time spent:35 minutes  Chief Complaint:  Chief Complaint   Depression    HISTORY/CURRENT STATUS: HPI not doing well.  She is accompanied by her husband Joy Marshall.  Joy Marshall does most of the talking. He states  Joy Marshall has been "off," off and on for several months.  They went to [facility] on 02/28/2023 with concerns of altered mental status.  Exam was normal, she was not suicidal and there was no delirium or psychosis and she was allowed to go home with recommendations of seeing her PCP for evaluation of physical issues that may be causing her symptoms.  They have not been to the PCP yet.    Joy Marshall found out that she is not taking her medications like she is supposed to.  She feels that she does not need it.  So would take Seroquel whenever she felt like it.  She tells me now she does not need it.  Then Joy Marshall started giving her the Seroquel every night about a month ago, then found her spitting the pill out.  He started having her open her mouth, move her tongue around to make sure she was swallowing it.  Even with that, it has been hit or miss.  She seems to be very depressed.  What he means by being "off" is that she sits and stares out at seemingly nothing, does not want to do anything, she is sleeping a lot, ADLs are decreased, personal hygiene is pretty normal for her.  She does not cry easily, does not talk much at all so it is hard to say if she has any feelings of hopelessness or not but it seems that she does.  No reports of anxiety but he feels that she is anxious a lot.  He has recently been diagnosed with breast cancer and has been given less than a year to live.  He feels like that's at least part of the reason she's acting this way. But she does not talk about it.  She denies suicidal or homicidal thoughts.  Patient denies increased energy  with decreased need for sleep, increased talkativeness, in fact she rarely talks, racing thoughts, impulsivity or risky behaviors, increased spending, grandiosity, she gets frustrated when he gives her her pills but no increased irritability or anger otherwise, paranoia, or hallucinations.  Review of Systems  Constitutional:  Positive for malaise/fatigue.  HENT: Negative.    Eyes: Negative.   Respiratory: Negative.    Cardiovascular: Negative.   Gastrointestinal: Negative.   Genitourinary: Negative.   Musculoskeletal: Negative.   Skin: Negative.   Neurological: Negative.   Endo/Heme/Allergies: Negative.   Psychiatric/Behavioral:         See HPI   Individual Medical History/ Review of Systems: Changes? :No     Past medications for mental health diagnoses include: Seroquel, Ativan, Ambien  Allergies: Aspirin  Current Medications:  Current Outpatient Medications:    LORazepam (ATIVAN) 1 MG tablet, Take 1 tablet (1 mg total) by mouth every 8 (eight) hours as needed. for anxiety (Patient not taking: Reported on 03/10/2023), Disp: 135 tablet, Rfl: 1   Multiple Vitamin (MULTIVITAMIN) tablet, Take 1 tablet by mouth daily., Disp: , Rfl:    QUEtiapine (SEROQUEL) 400 MG tablet, Take 1 tablet (400 mg total) by mouth at bedtime. (Patient not taking: Reported on 03/10/2023), Disp: 90 tablet, Rfl: 1 Medication Side Effects: none  Family Medical/ Social History: Changes?  Her husband has been diagnosed with breast cancer and has been given less than a year to live  MENTAL HEALTH EXAM:  There were no vitals taken for this visit.There is no height or weight on file to calculate BMI.  General Appearance: Casual, Guarded, and Well Groomed  Eye Contact:  Fair  Speech:  Clear and Coherent and Normal Rate  Volume:  Decreased  Mood:  Euthymic  Affect:  Congruent  Thought Process:  Goal Directed and Descriptions of Associations: Circumstantial  Orientation:  Full (Time, Place, and Person)  Thought  Content: Illogical   Suicidal Thoughts:  No  Homicidal Thoughts:  No  Memory:  WNL  Judgement:  Good  Insight:  Good  Psychomotor Activity:   Walks with a cane  Concentration:  Concentration: Good  Recall:  Good  Fund of Knowledge: Good  Language: Good  Assets:  Desire for Improvement Financial Resources/Insurance Housing Social Support Transportation  ADL's:  Intact  Cognition: WNL  Prognosis:  Good   Labs followed by PCP.    DIAGNOSES:    ICD-10-CM   1. 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 Generalized anxiety disorder  F41.1     3. Stress due to illness of family member  Z63.79      Receiving Psychotherapy: No   RECOMMENDATIONS:  PDMP reviewed.  Ativan filled 11/17/2022. I provided 35  minutes of face to face time during this encounter, including time spent before and after the visit in records review, medical decision making, counseling pertinent to today's visit, and charting.   Patient doesn't feel like she needs the Seroquel b/c 'nothing is wrong' with her. But Joy Marshall can certainly tell a difference in her mood. Unsure if it's b/c of his dx of breast cancer or her not taking the Seroquel routinely or both. I think it's the latter and discussed w/ pt how we can get her to take it. She thinks the dose is too high, so agreed to 'try it' if I'll lower the dose. That's fine. It may not be as therapeutic but it's better for her to take something than nothing. She seems ok to take 1/2 pill (200 mg) which is therapeutic for some people.   Continue Ativan 1 mg, 1 p.o. 3 times daily as needed.  Use as sparingly as possible, she understands and accepts the risk of falls and confusion that occur with benzodiazepines and aging. Decrease Seroquel 400 mg to 1/2 pill nightly. Have labs drawn with her PCP as they direct. Return in 4 weeks.  Joy Overly, PA-C

## 2023-03-27 MED ORDER — QUETIAPINE FUMARATE 400 MG PO TABS: 200.0000 mg | ORAL_TABLET | Freq: Every day | ORAL | Status: DC

## 2023-04-01 ENCOUNTER — Ambulatory Visit: Payer: Medicare Other | Admitting: Physician Assistant

## 2023-04-13 ENCOUNTER — Ambulatory Visit (INDEPENDENT_AMBULATORY_CARE_PROVIDER_SITE_OTHER): Payer: Self-pay | Admitting: Physician Assistant

## 2023-04-13 DIAGNOSIS — Z91199 Patient's noncompliance with other medical treatment and regimen due to unspecified reason: Secondary | ICD-10-CM

## 2023-04-13 NOTE — Progress Notes (Signed)
No show

## 2023-05-19 ENCOUNTER — Other Ambulatory Visit: Payer: Self-pay | Admitting: Physician Assistant

## 2023-05-19 ENCOUNTER — Ambulatory Visit (INDEPENDENT_AMBULATORY_CARE_PROVIDER_SITE_OTHER): Payer: Medicare Other | Admitting: Physician Assistant

## 2023-05-19 ENCOUNTER — Encounter: Payer: Self-pay | Admitting: Physician Assistant

## 2023-05-19 DIAGNOSIS — F411 Generalized anxiety disorder: Secondary | ICD-10-CM

## 2023-05-19 DIAGNOSIS — F319 Bipolar disorder, unspecified: Secondary | ICD-10-CM

## 2023-05-19 MED ORDER — QUETIAPINE FUMARATE 400 MG PO TABS: 200.0000 mg | ORAL_TABLET | Freq: Every day | ORAL | 1 refills | Status: DC

## 2023-05-19 NOTE — Progress Notes (Signed)
[facility]  Patient ID: Joy Marshall,  MRN: [id number]  PCP: Patient, No Pcp Per  Date of Evaluation: 05/19/2023 Time spent: 21 minutes  Chief Complaint:  Chief Complaint   Follow-up    HISTORY/CURRENT STATUS: HPI 1 month overdue for med check.  Her husband Joy Marshall is with her.  At the last visit in August her husband reported that she had not been taking the Seroquel as prescribed.  I restarted the medication, although it looked like the dose had been decreased on her medication list.  Joy Marshall states she is taking the medication every night as directed.  Joy Marshall agrees. Pt says she's feeling fine.  She enjoyed a recent trip to visit family in [place].  Energy and motivation are fair to good depending on the day.  No extreme sadness, tearfulness, or feelings of hopelessness.  Sleeps well.  ADLs and personal hygiene are normal.  No change in memory.   Appetite has not changed.  Weight is stable.  No anxiety reported. Occas she needs the Ativan but not commonly. Denies suicidal or homicidal thoughts.  Patient denies increased energy with decreased need for sleep, increased talkativeness, racing thoughts, impulsivity or risky behaviors, increased spending, increased libido, grandiosity, increased irritability or anger, paranoia, or hallucinations.  Denies dizziness, syncope, seizures, numbness, tingling, tremor, tics, unsteady gait, slurred speech, confusion. Denies muscle or joint pain, stiffness, or dystonia. Denies unexplained weight loss, frequent infections, or sores that heal slowly.  No polyphagia, polydipsia, or polyuria. Denies visual changes or paresthesias.   Individual Medical History/ Review of Systems: Changes? :No     Past medications for mental health diagnoses include: Seroquel, Ativan, Ambien  Allergies: Aspirin  Current Medications:  Current Outpatient Medications:    LORazepam (ATIVAN) 1 MG tablet, Take 1 tablet (1 mg total) by mouth every 8 (eight)  hours as needed. for anxiety, Disp: 135 tablet, Rfl: 1   Multiple Vitamin (MULTIVITAMIN) tablet, Take 1 tablet by mouth daily., Disp: , Rfl:    QUEtiapine (SEROQUEL) 400 MG tablet, Take 0.5 tablets (200 mg total) by mouth at bedtime., Disp: 45 tablet, Rfl: 1 Medication Side Effects: none  Family Medical/ Social History: Changes?  Her husband has been diagnosed with breast cancer and has been given less than a year to live  MENTAL HEALTH EXAM:  There were no vitals taken for this visit.There is no height or weight on file to calculate BMI.  General Appearance: Casual, Guarded, and Well Groomed  Eye Contact:  Fair  Speech:  Clear and Coherent and Normal Rate  Volume:  Decreased  Mood:  Euthymic  Affect:  Flat  Thought Process:  Goal Directed and Descriptions of Associations: Circumstantial  Orientation:  Full (Time, Place, and Person)  Thought Content: Illogical   Suicidal Thoughts:  No  Homicidal Thoughts:  No  Memory:  WNL  Judgement:  Good  Insight:  Good  Psychomotor Activity:   Walks with a cane  Concentration:  Concentration: Good  Recall:  Good  Fund of Knowledge: Good  Language: Good  Assets:  Desire for Improvement Financial Resources/Insurance Housing Social Support Transportation  ADL's:  Intact  Cognition: WNL  Prognosis:  Good   Labs followed by PCP.    DIAGNOSES:    ICD-10-CM   1. Bipolar I disorder (HCC)  F31.9     2. Generalized anxiety disorder  F41.1       Receiving Psychotherapy: No   RECOMMENDATIONS:  PDMP reviewed.  Ativan filled 11/17/2022. I provided 21 minutes  of face to face time during this encounter, including time spent before and after the visit in records review, medical decision making, counseling pertinent to today's visit, and charting.   Joy Marshall asks why she has to be seen every 6 months. She was seeing another provider here over 8 years ago and he had her return annually.  He left the practice and care was transferred to me. Joy Marshall  was frustrated that I'm asking her to return every 6 months, and she has to pay more. He doesn't understand why. He and his wife were told it's normal practice for one on long term meds to be re-evaluated every 6 months. As far as the cost of appts is concerned, I asked him to talk with the office manager about it. I'm not familiar with billing issues at all.   Since she's taking the Seroquel consistently, she's doing well w/ mental health. No change in treatment.   Continue Ativan 1 mg, 1 p.o. 3 times daily as needed.  Use as sparingly as possible.  Continue Seroquel 400 mg, 1/2 pill nightly.  (I offered a 200 mg pill but her husband prefers to stick with the 400 mg and cut the dose in half.) Have labs drawn with her PCP as they direct.  She has an appointment for a physical next month. Return in 6 months.   Melony Overly, PA-C

## 2023-06-07 ENCOUNTER — Other Ambulatory Visit: Payer: Self-pay | Admitting: Physician Assistant

## 2023-06-08 NOTE — Telephone Encounter (Signed)
I called the pharmacy to clarify sig. Pharmacy states rf can't be filled until 11/12. I called pt but phone went to vm, was unable to lvm.

## 2023-10-04 ENCOUNTER — Other Ambulatory Visit: Payer: Self-pay | Admitting: Physician Assistant

## 2023-10-04 NOTE — Telephone Encounter (Signed)
 LF 10/11

## 2023-11-17 ENCOUNTER — Ambulatory Visit: Payer: Medicare Other | Admitting: Physician Assistant

## 2023-11-17 ENCOUNTER — Encounter: Payer: Self-pay | Admitting: Physician Assistant

## 2023-11-17 DIAGNOSIS — F319 Bipolar disorder, unspecified: Secondary | ICD-10-CM | POA: Diagnosis not present

## 2023-11-17 DIAGNOSIS — F411 Generalized anxiety disorder: Secondary | ICD-10-CM

## 2023-11-17 MED ORDER — LORAZEPAM 1 MG PO TABS: 1.0000 mg | ORAL_TABLET | Freq: Three times a day (TID) | ORAL | 1 refills | Status: DC | PRN

## 2023-11-17 MED ORDER — QUETIAPINE FUMARATE 400 MG PO TABS: 400.0000 mg | ORAL_TABLET | Freq: Every day | ORAL | 1 refills | Status: DC

## 2023-11-17 NOTE — Progress Notes (Signed)
 [facility]  Patient ID: Joy Marshall,  MRN: [id number]  PCP: Patient, No Pcp Per  Date of Evaluation: 11/17/2023  Time spent:25 minutes  Chief Complaint:  Chief Complaint   Follow-up     HISTORY/CURRENT STATUS: HPI For routine f/u. Husband is with her.   No major complaints, feels like mood is stable.  Energy and motivation are fair to good.  No extreme sadness, tearfulness, or feelings of hopelessness.  Sleeps well most of the time. ADLs and personal hygiene are normal.   Denies any changes in memory.  Appetite has not changed.  Weight is stable.  Has anxiety at times, just overwhelmed.  No PA.  Ativan helps.  Denies suicidal or homicidal thoughts.  Patient denies increased energy with decreased need for sleep, increased talkativeness, racing thoughts, impulsivity or risky behaviors, increased spending, increased libido, grandiosity, increased irritability or anger, paranoia, or hallucinations.  Denies dizziness, syncope, seizures, numbness, tingling, tremor, tics, unsteady gait, slurred speech, confusion. Denies muscle or joint pain, stiffness, or dystonia. Denies unexplained weight loss, frequent infections, or sores that heal slowly.  No polyphagia, polydipsia, or polyuria. Denies visual changes or paresthesias.   Individual Medical History/ Review of Systems: Changes? :No     Past medications for mental health diagnoses include: Seroquel, Ativan, Ambien  Allergies: Aspirin  Current Medications:  Current Outpatient Medications:    Multiple Vitamin (MULTIVITAMIN) tablet, Take 1 tablet by mouth daily., Disp: , Rfl:    LORazepam (ATIVAN) 1 MG tablet, Take 1 tablet (1 mg total) by mouth every 8 (eight) hours as needed. for anxiety, Disp: 135 tablet, Rfl: 1   QUEtiapine (SEROQUEL) 400 MG tablet, Take 1 tablet (400 mg total) by mouth at bedtime., Disp: 90 tablet, Rfl: 1 Medication Side Effects: none  Family Medical/ Social History: Changes?  Her husband has  been diagnosed with breast cancer and has been given less than a year to live  MENTAL HEALTH EXAM:  There were no vitals taken for this visit.There is no height or weight on file to calculate BMI.  General Appearance: Casual, Guarded, and Well Groomed  Eye Contact:  Fair  Speech:  Clear and Coherent and Normal Rate  Volume:  Decreased  Mood:  Euthymic  Affect:  Flat  Thought Process:  Goal Directed and Descriptions of Associations: Circumstantial  Orientation:  Full (Time, Place, and Person)  Thought Content: Illogical   Suicidal Thoughts:  No  Homicidal Thoughts:  No  Memory:  WNL  Judgement:  Good  Insight:  Good  Psychomotor Activity:   Walks with a cane  Concentration:  Concentration: Good and Attention Span: Good  Recall:  Good  Fund of Knowledge: Good  Language: Good  Assets:  Desire for Improvement Financial Resources/Insurance Housing Social Support Transportation  ADL's:  Intact  Cognition: WNL  Prognosis:  Good   Labs followed by PCP.    DIAGNOSES:    ICD-10-CM   1. Bipolar I disorder (HCC)  F31.9     2. Generalized anxiety disorder  F41.1       Receiving Psychotherapy: No   RECOMMENDATIONS:  PDMP reviewed.  Ativan filled 10/04/2023. I provided approx 25 minutes of face to face time during this encounter, including time spent before and after the visit in records review, medical decision making, counseling pertinent to today's visit, and charting.   Continue Ativan 1 mg, 1 p.o. 3 times daily as needed.  Use as sparingly as possible.  Continue Seroquel 400 mg, 1 po at bedtime. (She  usually takes 1/2 but sometimes 1 when they 'know she needs it.) This is fine since she's stable. Return in 6 months.   Marvia Slocumb, PA-C

## 2024-03-07 ENCOUNTER — Ambulatory Visit (INDEPENDENT_AMBULATORY_CARE_PROVIDER_SITE_OTHER): Payer: Self-pay | Admitting: Plastic Surgery

## 2024-03-07 ENCOUNTER — Encounter: Payer: Self-pay | Admitting: Plastic Surgery

## 2024-03-07 VITALS — BP 154/88 | HR 101 | Ht 63.0 in | Wt 219.8 lb

## 2024-03-07 DIAGNOSIS — Q178 Other specified congenital malformations of ear: Secondary | ICD-10-CM

## 2024-03-07 NOTE — Progress Notes (Signed)
   Referring Provider Gladystine Erminio CROME, MD [address] [place],  [place] [address]   CC:  Chief Complaint  Patient presents with   Consult           Joy Marshall is an 78 y.o. female.  HPI: Joy Marshall is a 78 year old female who presents today with complaints of a torn left earlobe where her earring pulled through.  She is interested in surgical repair of the earlobe.  She is not on any type of blood thinners.  Allergies  Allergen Reactions   Aspirin Other (See Comments)    Irritates bowels    Outpatient Encounter Medications as of 03/07/2024  Medication Sig   LORazepam  (ATIVAN ) 1 MG tablet Take 1 tablet (1 mg total) by mouth every 8 (eight) hours as needed. for anxiety   Multiple Vitamin (MULTIVITAMIN) tablet Take 1 tablet by mouth daily.   QUEtiapine  (SEROQUEL ) 400 MG tablet Take 1 tablet (400 mg total) by mouth at bedtime.   No facility-administered encounter medications on file as of 03/07/2024.     No past medical history on file.  No past surgical history on file.  No family history on file.  Social History   Social History Narrative   Not on file     Review of Systems General: Denies fevers, chills, weight loss CV: Denies chest pain, shortness of breath, palpitations Skin: Left earlobe with a split in the earlobe  Physical Exam    03/07/2024    1:45 PM 02/28/2023    1:35 PM  Vitals with BMI  Height 5' 3   Weight 219 lbs 13 oz   BMI 38.95   Systolic 152   Diastolic 84   Pulse 103      Information is confidential and restricted. Go to Review Flowsheets to unlock data.    General:  No acute distress,  Alert and oriented, Non-Toxic, Normal speech and affect Skin: Left earlobe with complete tear all the way through.  Both surfaces are completely epithelialized.  There is some atrophy of the earlobe. Mammogram: Not applicable Assessment/Plan Split earlobe: Patient has a complete tear through the earlobe with epithelialized surfaces.   I explained to her the process of epithelialization and why the earlobe will not heal.  I explained to her how the repair is accomplished.  She understands she will have sutures in place for approximately 1 week and then these will need to be removed in the office.  She understands that she should not pierced the ear for 6 months.  All questions were answered to her satisfaction.  Photographs were obtained today with her consent.  Will schedule her for repair of the earlobe in the office at her request.  Leonce KATHEE [place] 03/07/2024, 1:57 PM

## 2024-04-04 ENCOUNTER — Ambulatory Visit (INDEPENDENT_AMBULATORY_CARE_PROVIDER_SITE_OTHER): Payer: Self-pay | Admitting: Plastic Surgery

## 2024-04-04 ENCOUNTER — Telehealth: Payer: Self-pay | Admitting: Plastic Surgery

## 2024-04-04 VITALS — BP 148/87 | HR 98

## 2024-04-04 DIAGNOSIS — Q178 Other specified congenital malformations of ear: Secondary | ICD-10-CM

## 2024-04-04 NOTE — Telephone Encounter (Signed)
 called and lvmail to be here 15 mins early// pt must be here 15 mins early or procedure would be r/s

## 2024-04-11 ENCOUNTER — Ambulatory Visit (INDEPENDENT_AMBULATORY_CARE_PROVIDER_SITE_OTHER): Payer: Self-pay | Admitting: Plastic Surgery

## 2024-04-11 DIAGNOSIS — Q178 Other specified congenital malformations of ear: Secondary | ICD-10-CM

## 2024-04-11 NOTE — Progress Notes (Signed)
 Ms. Joy Marshall returns today approximately 1 week post repair of her left earlobe.  She had no problems and no complaints.  On examination the repair is healing nicely.  Sutures removed today without difficulty.  I discussed with her scar massage and waiting 6 months to repierce the ear.  She may follow-up with me for ear piercing or issues a ear piercing of her choice.

## 2024-04-11 NOTE — Progress Notes (Signed)
 Procedure Note  Preoperative Dx: Split left earlobe  Postoperative Dx: Same  Procedure: Repair of left earlobe  Anesthesia: Lidocaine 1% with 1:100,000 epinephrine and 0.25% Sensorcaine   Indication for Procedure: Repair of acquired defect  Description of Procedure: Risks and complications were explained to the patient including the possibility of Rie lacerating the ear..  Consent was confirmed and the patient understands the risks and benefits.  The potential complications and alternatives were explained and the patient consents.  The patient expressed understanding the option of not having the procedure and the risks of a scar.  Time out was called and all information was confirmed to be correct.    The area was prepped and drapped.  Local anesthetic was injected in the subcutaneous tissues.  After waiting for the local to take affect the epithelialized surfaces were excised sharply.  After obtaining hemostasis, the surgical wound was closed the subcutaneous tissues were closed with an interrupted 4-0 Monocryl suture and the skin edges approximated with interrupted 5-0 Prolene sutures.  The surgical wound measured 1 cm.  A dressing was applied.  The patient was given instructions on how to care for the area and a follow up appointment.  Nayra tolerated the procedure well and there were no complications.SABRA

## 2024-05-22 ENCOUNTER — Ambulatory Visit (INDEPENDENT_AMBULATORY_CARE_PROVIDER_SITE_OTHER): Admitting: Physician Assistant

## 2024-05-22 ENCOUNTER — Encounter: Payer: Self-pay | Admitting: Physician Assistant

## 2024-05-22 DIAGNOSIS — F411 Generalized anxiety disorder: Secondary | ICD-10-CM | POA: Diagnosis not present

## 2024-05-22 DIAGNOSIS — Z6379 Other stressful life events affecting family and household: Secondary | ICD-10-CM

## 2024-05-22 DIAGNOSIS — F319 Bipolar disorder, unspecified: Secondary | ICD-10-CM

## 2024-05-22 MED ORDER — LORAZEPAM 1 MG PO TABS: 1.0000 mg | ORAL_TABLET | Freq: Three times a day (TID) | ORAL | 1 refills | Status: AC | PRN

## 2024-05-22 MED ORDER — QUETIAPINE FUMARATE 400 MG PO TABS: 400.0000 mg | ORAL_TABLET | Freq: Every day | ORAL | 1 refills | Status: AC

## 2024-05-22 NOTE — Progress Notes (Signed)
 [facility]  Patient ID: Joy Marshall,  MRN: [id number]  PCP: Gladystine Erminio CROME, MD  Date of Evaluation: 05/22/2024  Time spent:20 minutes  Chief Complaint:  Chief Complaint   Follow-up    HISTORY/CURRENT STATUS: HPI For routine f/u.    Doing well with current meds.  Energy and motivation are pretty good most of the time. No extreme sadness, tearfulness, or feelings of hopelessness.  Sleeping ok.  ADLs and personal hygiene are normal.   Denies any changes in concentration, making decisions, or remembering things.  Appetite has not changed.  Anxiety is well controlled with the Ativan .  It doesn't make her dizzy or off balance.  No mania, delirium, AH/VH.  No SI/HI.  Individual Medical History/ Review of Systems: Changes? :No     Past medications for mental health diagnoses include: Seroquel , Ativan , Ambien   Allergies: Aspirin  Current Medications:  Current Outpatient Medications:    Multiple Vitamin (MULTIVITAMIN) tablet, Take 1 tablet by mouth daily., Disp: , Rfl:    LORazepam  (ATIVAN ) 1 MG tablet, Take 1 tablet (1 mg total) by mouth every 8 (eight) hours as needed. for anxiety, Disp: 135 tablet, Rfl: 1   QUEtiapine  (SEROQUEL ) 400 MG tablet, Take 1 tablet (400 mg total) by mouth at bedtime., Disp: 90 tablet, Rfl: 1 Medication Side Effects: none  Family Medical/ Social History: Changes?  Her husband is doing well, getting treatment for breast cancer.   MENTAL HEALTH EXAM:  There were no vitals taken for this visit.There is no height or weight on file to calculate BMI.  General Appearance: Casual and Well Groomed  Eye Contact:  Fair  Speech:  Clear and Coherent and Normal Rate  Volume:  Decreased  Mood:  Euthymic  Affect:  Congruent  Thought Process:  Goal Directed and Descriptions of Associations: Circumstantial  Orientation:  Full (Time, Place, and Person)  Thought Content: Illogical   Suicidal Thoughts:  No  Homicidal Thoughts:  No  Memory:  WNL   Judgement:  Good  Insight:  Good  Psychomotor Activity:  Walks with a cane  Concentration:  Concentration: Good and Attention Span: Good  Recall:  Good  Fund of Knowledge: Good  Language: Good  Assets:  Communication Skills Desire for Improvement Financial Resources/Insurance Housing Social Support Transportation  ADL's:  Intact  Cognition: WNL  Prognosis:  Good   Labs followed by PCP.    DIAGNOSES:    ICD-10-CM   1. Bipolar I disorder (HCC)  F31.9     2. Generalized anxiety disorder  F41.1     3. Stress due to illness of family member  Z63.79      Receiving Psychotherapy: No   RECOMMENDATIONS:  PDMP reviewed.  Ativan  filled 03/01/2024. I provided approximately 20 minutes of face to face time during this encounter, including time spent before and after the visit in records review, medical decision making, counseling pertinent to today's visit, and charting.   She is doing well with current meds so no changes will be made.  Continue Ativan  1 mg, 1 p.o. 3 times daily as needed.  Use as sparingly as possible.  Continue Seroquel  400 mg, 1 po at bedtime. (She usually takes 1/2 but sometimes 1 when they 'know she needs it.) This is fine since she's stable. Return in 6 months.   Verneita Cooks, PA-C

## 2024-06-14 ENCOUNTER — Other Ambulatory Visit: Payer: Self-pay | Admitting: Physician Assistant

## 2024-11-20 ENCOUNTER — Ambulatory Visit: Admitting: Physician Assistant
